# Patient Record
Sex: Female | Born: 2000 | Race: Black or African American | Hispanic: No | Marital: Single | Smoking: Never smoker
Health system: Southern US, Community
[De-identification: ages and names within clinical notes are randomized; demographics above are authoritative.]

---

## 2021-01-01 DIAGNOSIS — E0590 Thyrotoxicosis, unspecified without thyrotoxic crisis or storm: Secondary | ICD-10-CM

## 2021-01-01 HISTORY — DX: Thyrotoxicosis, unspecified without thyrotoxic crisis or storm: E05.90

## 2021-08-20 ENCOUNTER — Encounter (HOSPITAL_COMMUNITY): Payer: Self-pay

## 2021-08-20 ENCOUNTER — Emergency Department (HOSPITAL_COMMUNITY)

## 2021-08-20 ENCOUNTER — Emergency Department (HOSPITAL_COMMUNITY)
Admission: EM | Admit: 2021-08-20 | Discharge: 2021-08-20 | Disposition: A | Discharge status: Home / Self Care | Attending: Emergency Medicine | Admitting: Emergency Medicine

## 2021-08-20 ENCOUNTER — Other Ambulatory Visit: Payer: Self-pay

## 2021-08-20 DIAGNOSIS — E039 Hypothyroidism, unspecified: Secondary | ICD-10-CM | POA: Diagnosis not present

## 2021-08-20 DIAGNOSIS — N9489 Other specified conditions associated with female genital organs and menstrual cycle: Secondary | ICD-10-CM | POA: Insufficient documentation

## 2021-08-20 DIAGNOSIS — G43409 Hemiplegic migraine, not intractable, without status migrainosus: Secondary | ICD-10-CM | POA: Diagnosis not present

## 2021-08-20 DIAGNOSIS — R519 Headache, unspecified: Secondary | ICD-10-CM | POA: Diagnosis present

## 2021-08-20 DIAGNOSIS — R799 Abnormal finding of blood chemistry, unspecified: Secondary | ICD-10-CM | POA: Insufficient documentation

## 2021-08-20 LAB — T4, FREE: Free T4: 0.78 ng/dL (ref 0.61–1.12)

## 2021-08-20 LAB — TROPONIN I (HIGH SENSITIVITY): Troponin I (High Sensitivity): <2 ng/L (ref <18)

## 2021-08-20 LAB — URINALYSIS, ROUTINE W REFLEX MICROSCOPIC
Bilirubin Urine: NEGATIVE
Glucose, UA: NEGATIVE mg/dL
Hgb urine dipstick: NEGATIVE
Ketones, ur: NEGATIVE mg/dL
Leukocytes,Ua: NEGATIVE
Nitrite: NEGATIVE
Protein, ur: NEGATIVE mg/dL
Specific Gravity, Urine: 1.003 — ABNORMAL LOW (ref 1.005–1.030)
pH: 7 (ref 5.0–8.0)

## 2021-08-20 LAB — BASIC METABOLIC PANEL
Anion gap: 7 (ref 5–15)
BUN: 8 mg/dL (ref 6–20)
CO2: 22 mmol/L (ref 22–32)
Calcium: 9 mg/dL (ref 8.9–10.3)
Chloride: 110 mmol/L (ref 98–111)
Creatinine, Ser: 0.59 mg/dL (ref 0.44–1.00)
GFR, Estimated: >60 mL/min (ref >60)
Glucose, Bld: 98 mg/dL (ref 70–99)
Potassium: 3.8 mmol/L (ref 3.5–5.1)
Sodium: 139 mmol/L (ref 135–145)

## 2021-08-20 LAB — I-STAT BETA HCG BLOOD, ED (MC, WL, AP ONLY): I-stat hCG, quantitative: <5 m[IU]/mL (ref <5)

## 2021-08-20 LAB — CBC
HCT: 36.7 % (ref 36.0–46.0)
Hemoglobin: 11.7 g/dL — ABNORMAL LOW (ref 12.0–15.0)
MCH: 24.5 pg — ABNORMAL LOW (ref 26.0–34.0)
MCHC: 31.9 g/dL (ref 30.0–36.0)
MCV: 76.8 fL — ABNORMAL LOW (ref 80.0–100.0)
Platelets: 384 10*3/uL (ref 150–400)
RBC: 4.78 MIL/uL (ref 3.87–5.11)
RDW: 16 % — ABNORMAL HIGH (ref 11.5–15.5)
WBC: 9.2 10*3/uL (ref 4.0–10.5)
nRBC: 0 % (ref 0.0–0.2)

## 2021-08-20 LAB — TSH: TSH: 0.024 u[IU]/mL — ABNORMAL LOW (ref 0.350–4.500)

## 2021-08-20 LAB — MAGNESIUM: Magnesium: 1.9 mg/dL (ref 1.7–2.4)

## 2021-08-20 LAB — CBG MONITORING, ED: Glucose-Capillary: 111 mg/dL — ABNORMAL HIGH (ref 70–99)

## 2021-08-20 IMAGING — MR MR HEAD W/O CM
10 series · 48 of 48 positions shown · non-contrast
Comparison: MR venogram same day

CLINICAL DATA: Neuro deficit, acute, stroke suspected. Nausea,
vomiting and weakness over the last 48 hours. Left sided weakness
and pain.

EXAM:
MRI HEAD WITHOUT CONTRAST
TECHNIQUE: Multiplanar, multiecho pulse sequences of the brain and surrounding
structures were obtained without intravenous contrast.

[Series 13: DWI · axial · 3.0mm · 1.36mm/px · z∈[-87,+65]mm · 8 of 104 slices shown (1 of 4)]
[im 1/104]
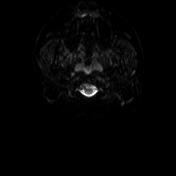
[im 15/104]
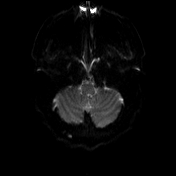
[im 30/104]
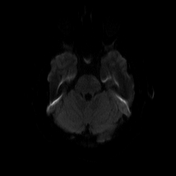
[im 45/104]
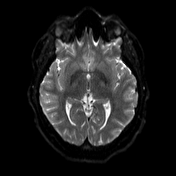
[im 59/104]
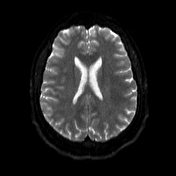
[im 74/104]
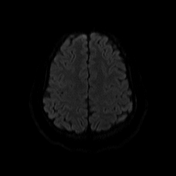
[im 89/104]
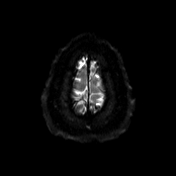
[im 104/104]
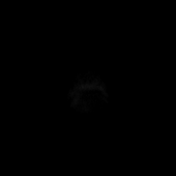

[Series 14: DWI · axial · 3.0mm · 1.36mm/px · z∈[-87,+65]mm · 4 of 52 slices shown (2 of 4)]
[im 1/52]
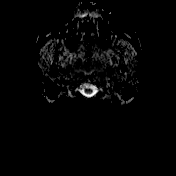
[im 18/52]
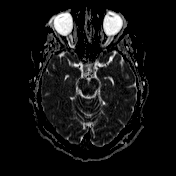
[im 35/52]
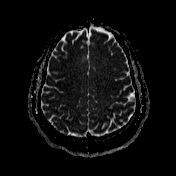
[im 52/52]
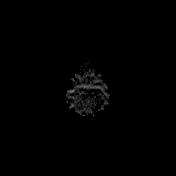

[Series 15: T1 · sagittal · 5.0mm · 0.75mm/px · 2 of 24 slices shown (1 of 2)]
[im 1/24]
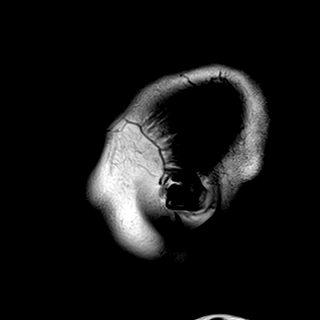
[im 24/24]
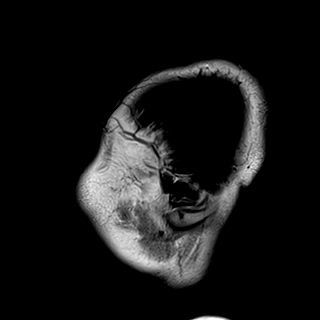

[Series 16: T2 · axial · 5.0mm · 0.62mm/px · z∈[-82,+60]mm · 2 of 23 slices shown (1 of 2)]
[im 1/23]
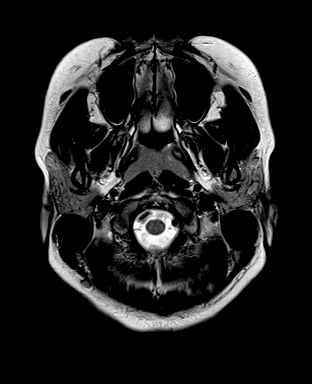
[im 23/23]
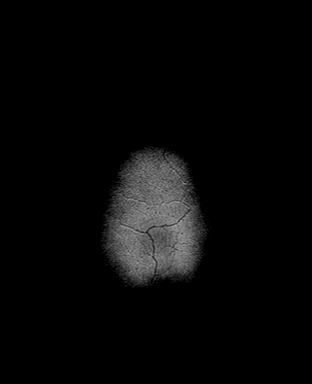

[Series 17: swi_images · axial · 3.0mm · 0.75mm/px · z∈[-93,+71]mm · 5 of 56 slices shown]
[im 1/56]
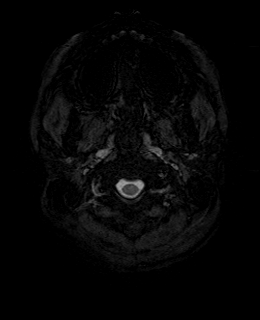
[im 14/56]
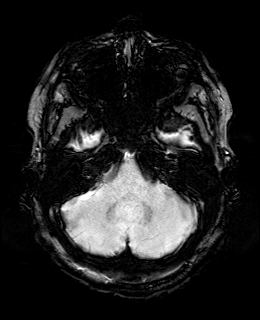
[im 28/56]
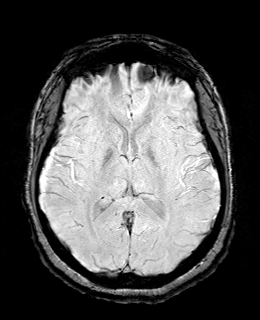
[im 42/56]
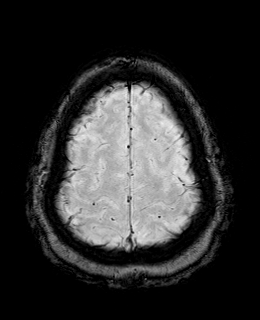
[im 56/56]
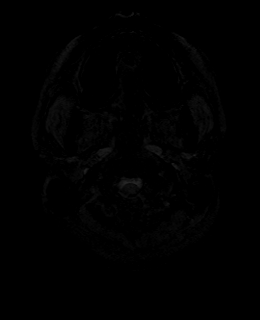

[Series 19: T1 · axial · 1.0mm · 0.94mm/px · z∈[-86,+56]mm · 12 of 144 slices shown (2 of 2)]
[im 1/144]
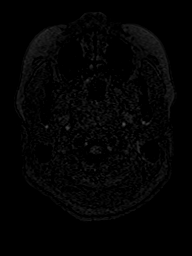
[im 14/144]
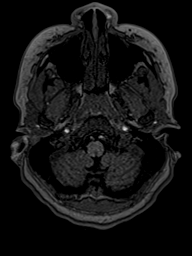
[im 27/144]
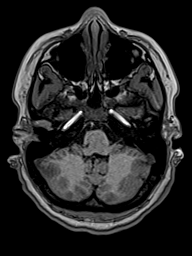
[im 40/144]
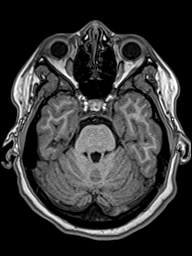
[im 53/144]
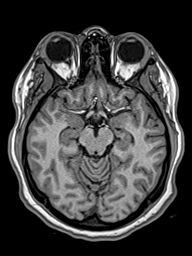
[im 66/144]
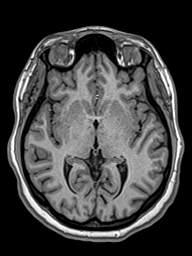
[im 79/144]
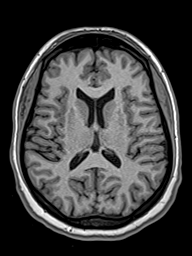
[im 92/144]
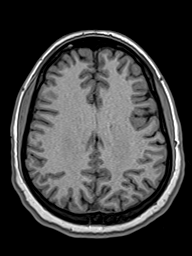
[im 105/144]
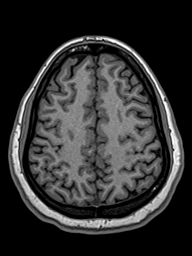
[im 118/144]
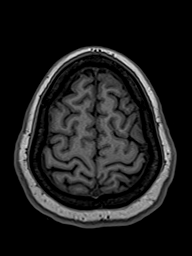
[im 131/144]
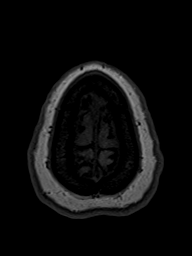
[im 144/144]
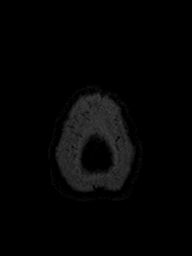

[Series 20: FLAIR · axial · 3.0mm · 0.75mm/px · z∈[-84,+57]mm · 4 of 48 slices shown]
[im 1/48]
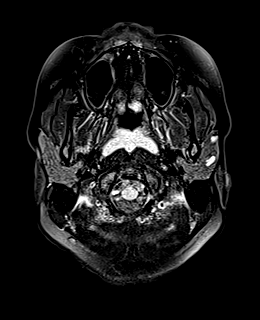
[im 16/48]
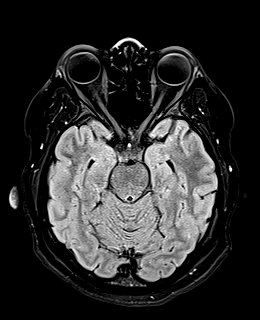
[im 32/48]
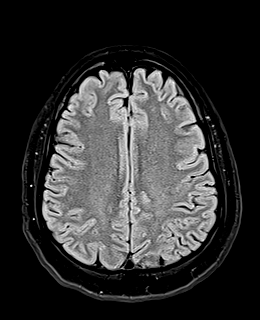
[im 48/48]
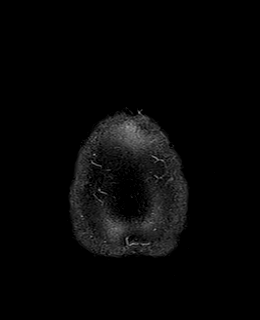

[Series 21: DWI · coronal · 5.0mm · 1.31mm/px · 6 of 76 slices shown (3 of 4)]
[im 1/76]
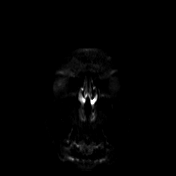
[im 16/76]
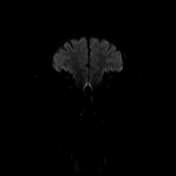
[im 31/76]
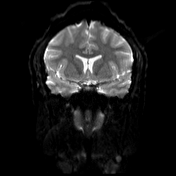
[im 46/76]
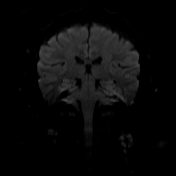
[im 61/76]
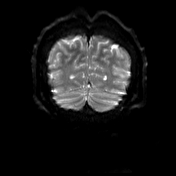
[im 76/76]
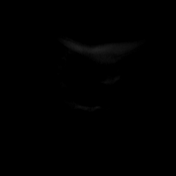

[Series 22: DWI · coronal · 5.0mm · 1.31mm/px · 3 of 38 slices shown (4 of 4)]
[im 1/38]
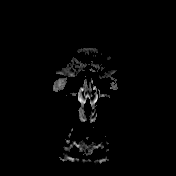
[im 19/38]
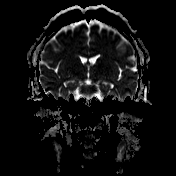
[im 38/38]
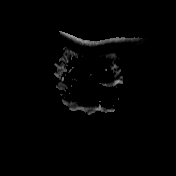

[Series 23: T2 · coronal · 5.0mm · 0.57mm/px · 2 of 30 slices shown (2 of 2)]
[im 1/30]
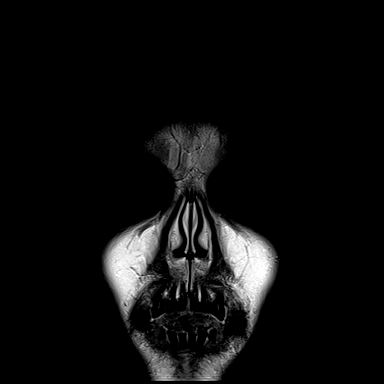
[im 30/30]
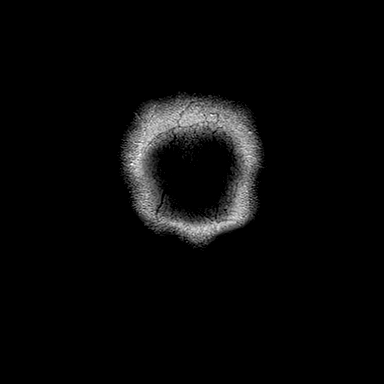

[48 of 48 positions shown; findings below may reference images not displayed]

FINDINGS: Brain: The brain has a normal appearance without evidence of
malformation, atrophy, old or acute small or large vessel
infarction, mass lesion, hemorrhage, hydrocephalus or extra-axial
collection.

Vascular: Major vessels at the base of the brain show flow. Venous
sinuses appear patent.

Skull and upper cervical spine: Normal.

Sinuses/Orbits: Clear/normal.

Other: None significant.
IMPRESSION: Normal examination. No abnormality seen to explain the clinical
presentation.

## 2021-08-20 IMAGING — MR MR [PERSON_NAME] HEAD
1 series · 48 of 48 positions shown · non-contrast
Comparison: No pertinent prior exam.

CLINICAL DATA: Nausea, vomiting and weakness. Dural venous sinus
thrombosis suspected.

EXAM:
MR VENOGRAM HEAD WITHOUT CONTRAST
TECHNIQUE: Angiographic images of the intracranial venous structures were
acquired using MRV technique without intravenous contrast.

[Series 4: cor 2d · coronal · 2.5mm · 0.69mm/px · 48 of 120 slices shown]
[im 1/120]
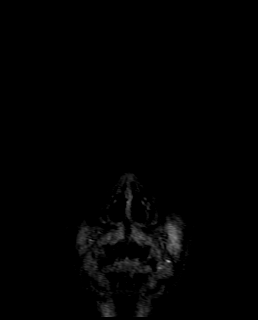
[im 3/120]
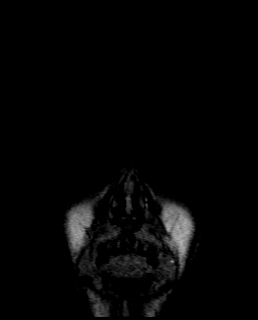
[im 6/120]
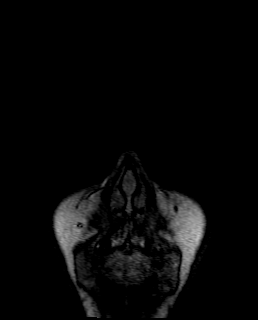
[im 8/120]
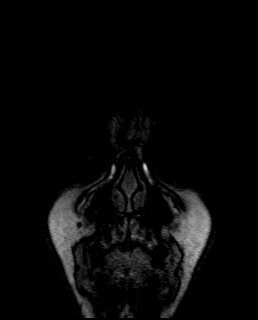
[im 11/120]
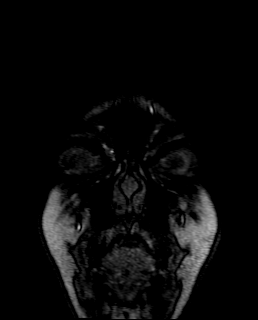
[im 13/120]
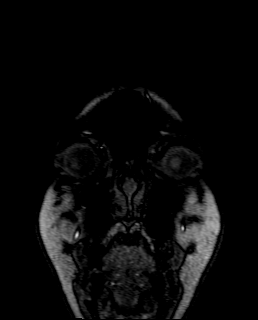
[im 16/120]
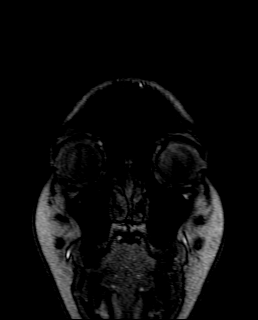
[im 18/120]
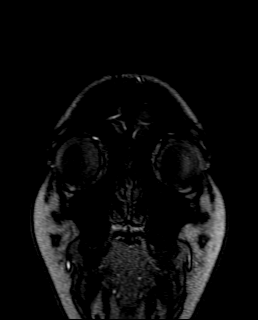
[im 21/120]
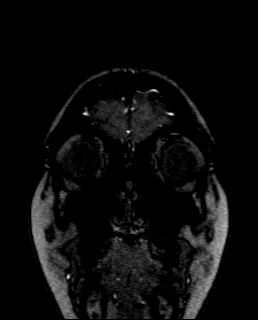
[im 23/120]
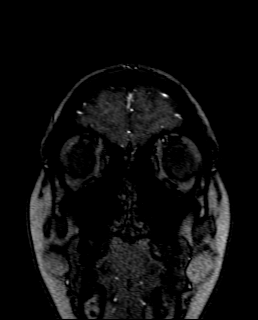
[im 26/120]
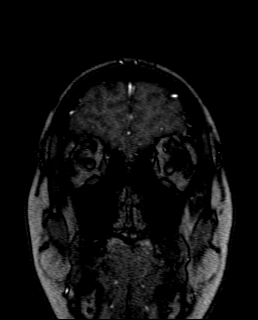
[im 28/120]
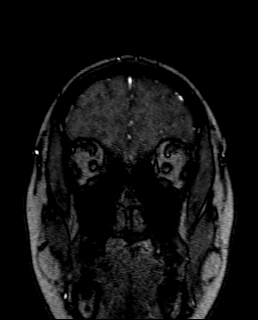
[im 31/120]
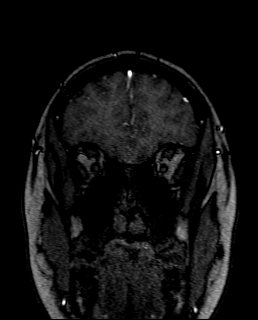
[im 33/120]
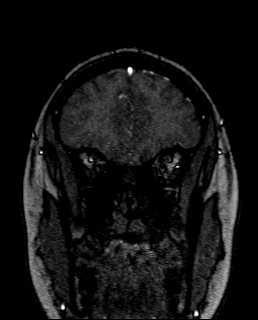
[im 36/120]
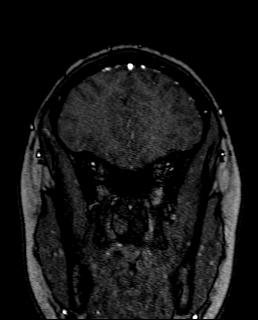
[im 38/120]
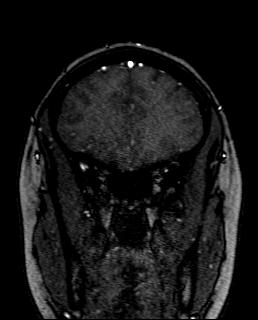
[im 41/120]
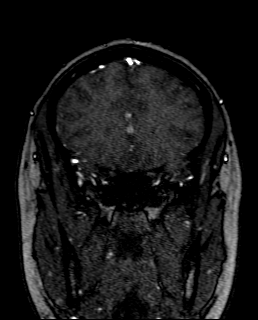
[im 44/120]
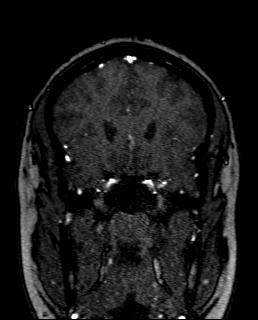
[im 46/120]
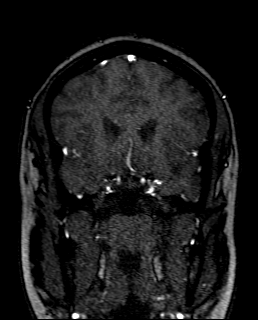
[im 49/120]
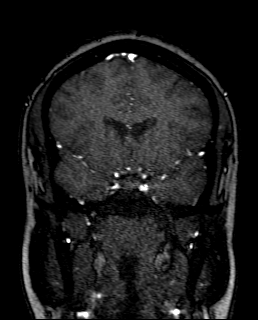
[im 51/120]
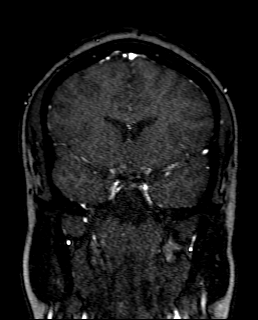
[im 54/120]
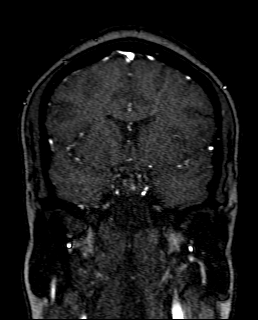
[im 56/120]
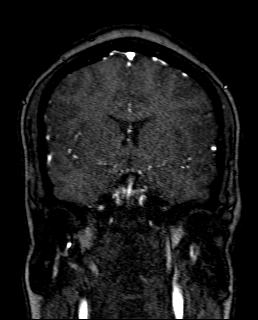
[im 59/120]
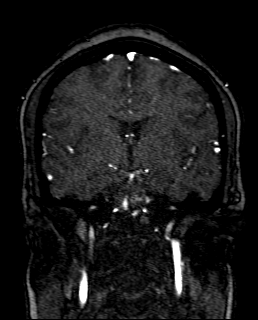
[im 61/120]
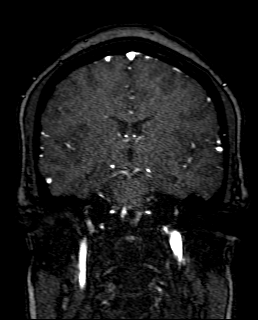
[im 64/120]
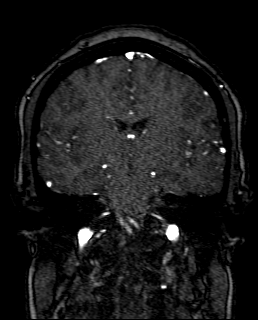
[im 66/120]
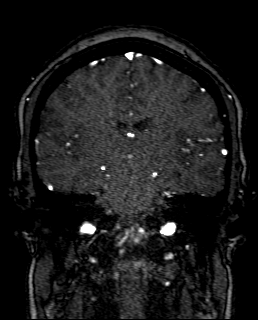
[im 69/120]
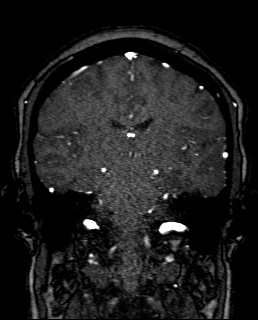
[im 71/120]
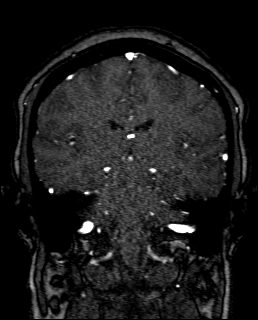
[im 74/120]
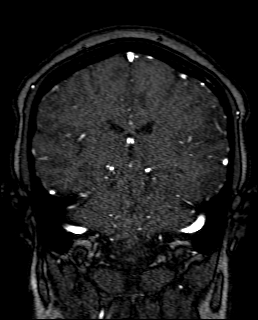
[im 76/120]
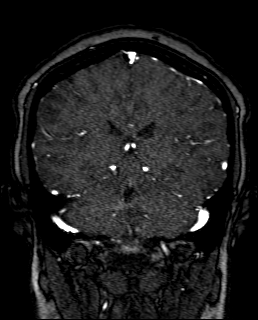
[im 79/120]
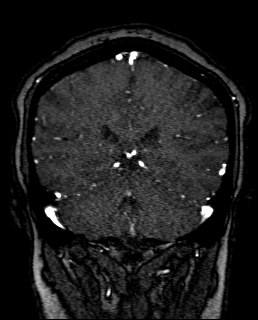
[im 82/120]
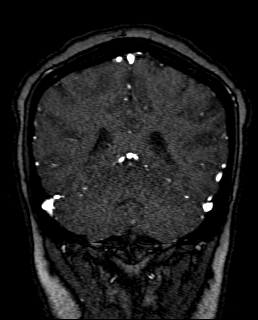
[im 84/120]
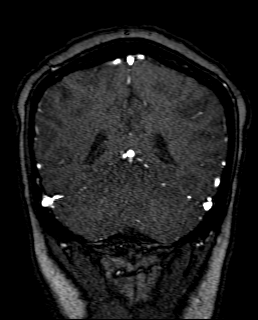
[im 87/120]
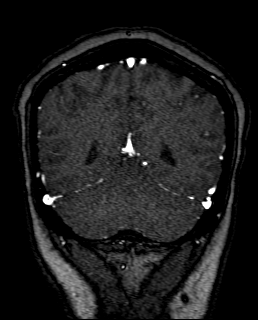
[im 89/120]
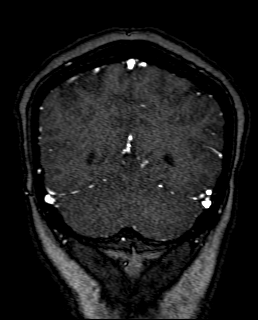
[im 92/120]
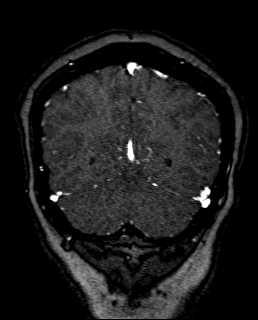
[im 94/120]
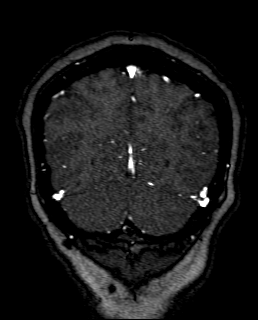
[im 97/120]
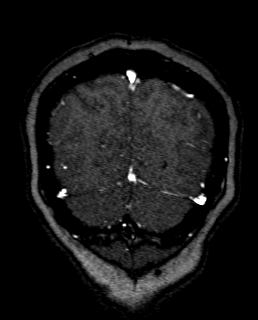
[im 99/120]
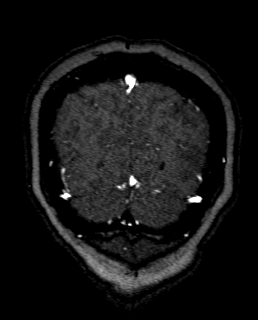
[im 102/120]
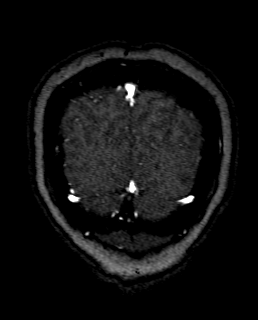
[im 104/120]
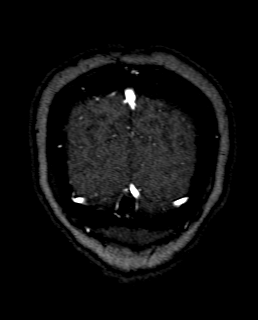
[im 107/120]
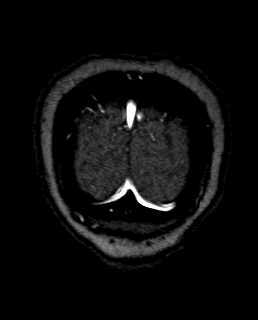
[im 109/120]
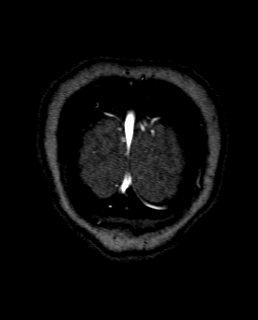
[im 112/120]
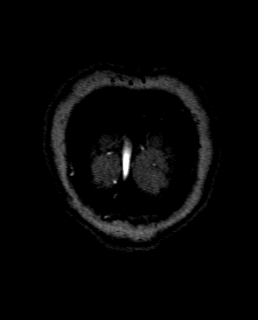
[im 114/120]
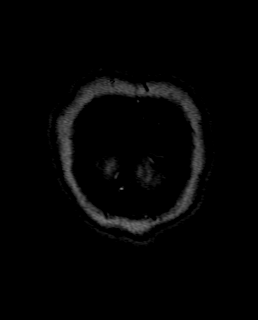
[im 117/120]
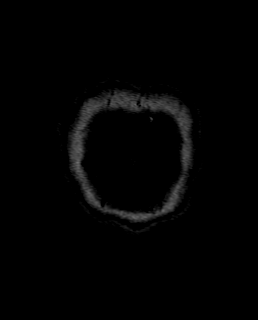
[im 120/120]
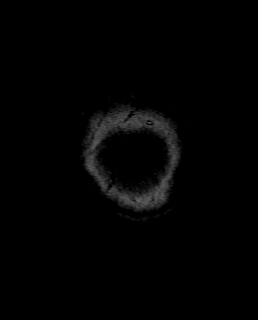

[48 of 48 positions shown; findings below may reference images not displayed]

FINDINGS: Superior sagittal sinus is widely patent. Both transverse sinuses
are patent. Sigmoid sinuses and jugular veins are patent. Straight
sinus is patent. No sign of superficial thrombosis.
IMPRESSION: Normal intracranial MR venogram.

## 2021-08-20 IMAGING — CT CT HEAD W/O CM
3 series · 16 of 47 positions shown, 19 images · non-contrast
Comparison: None available.

CLINICAL DATA: Headache, new or worsening headache in a 20-year-old
female.



[Series 2: head wo · axial · 0.43mm/px · z∈[+1087,+1222]mm · 10 of 33 slices shown, 13 images]
[im 3/33  brain]
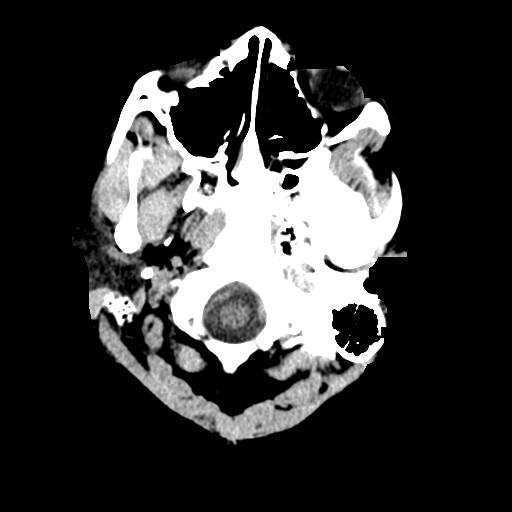
[im 3/33  bone]
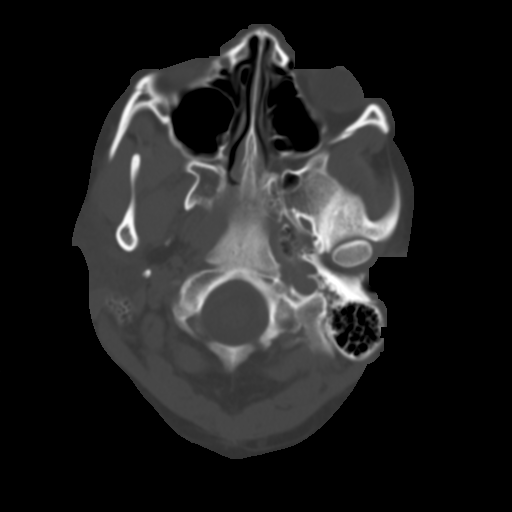
[im 6/33  brain]
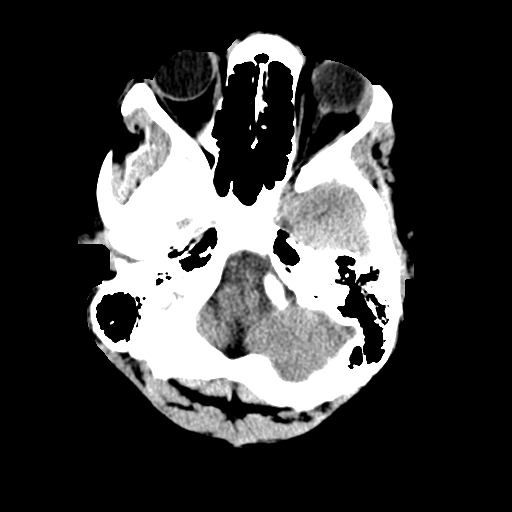
[im 9/33  brain]
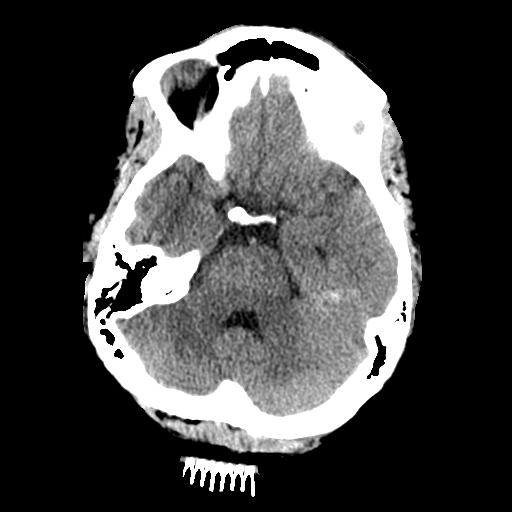
[im 12/33  brain]
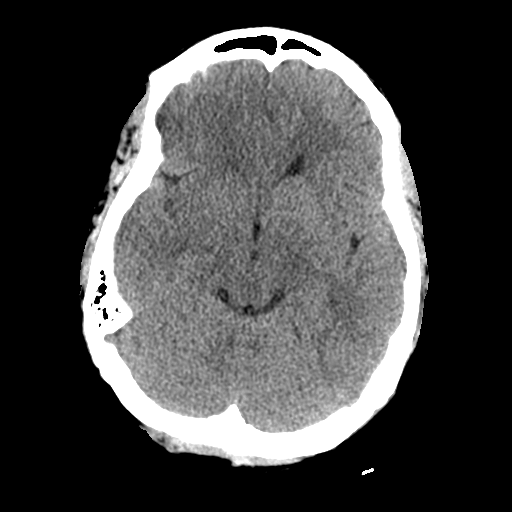
[im 15/33  brain]
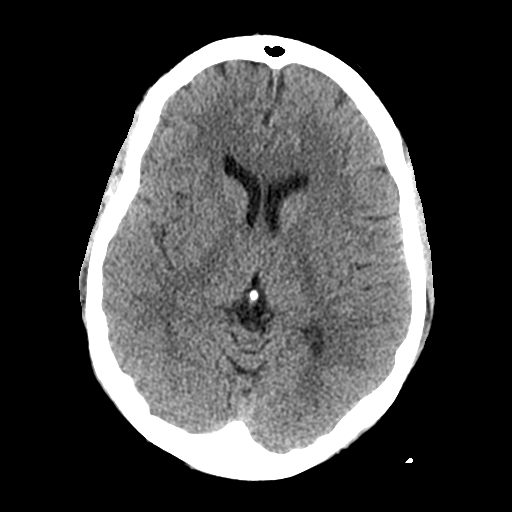
[im 15/33  bone]
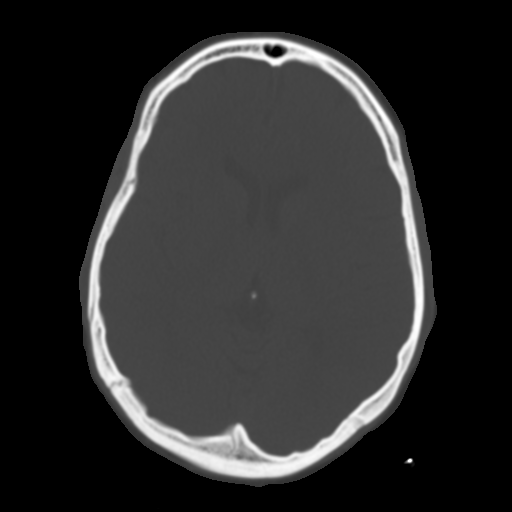
[im 18/33  brain]
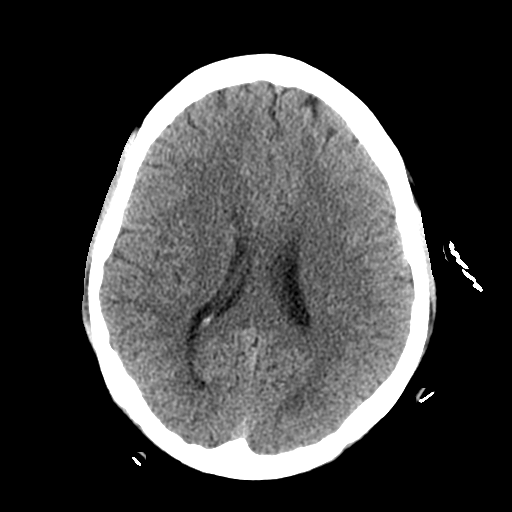
[im 21/33  brain]
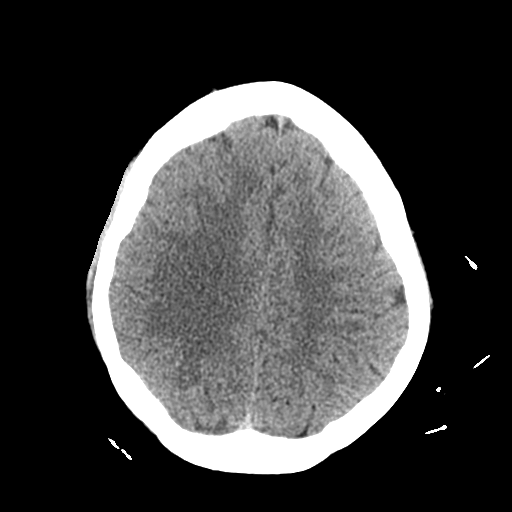
[im 25/33  brain]
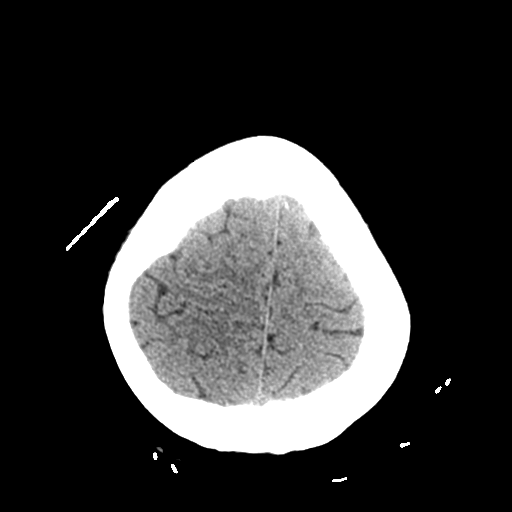
[im 27/33  brain]
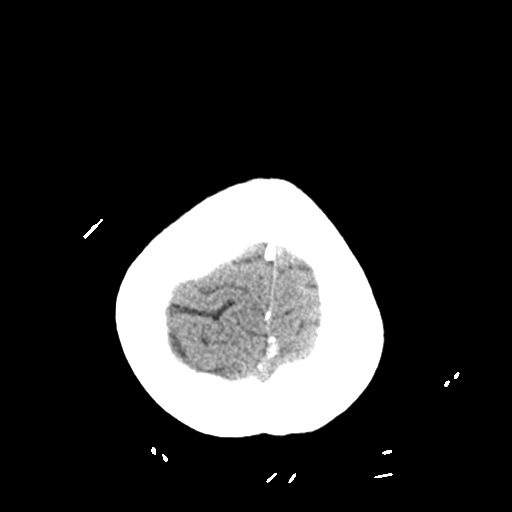
[im 27/33  bone]
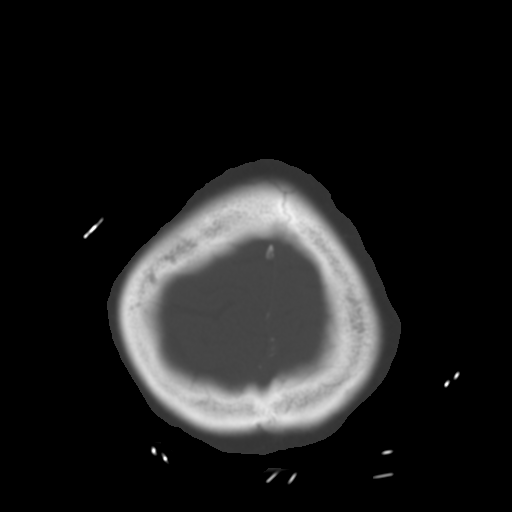
[im 30/33  brain]
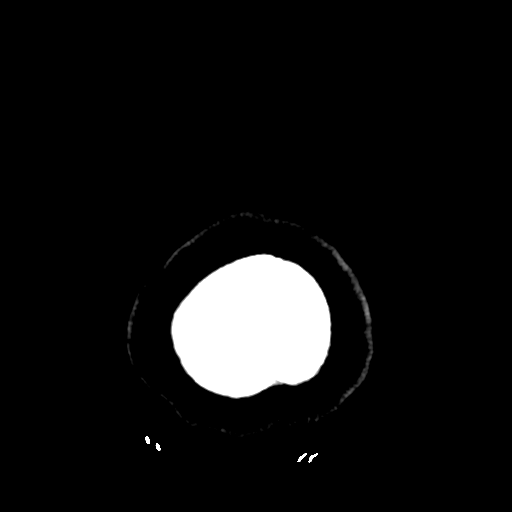

[Series 5: coronal soft tissue · coronal · 0.32mm/px · 3 of 69 slices shown]
[im 24/69  brain]
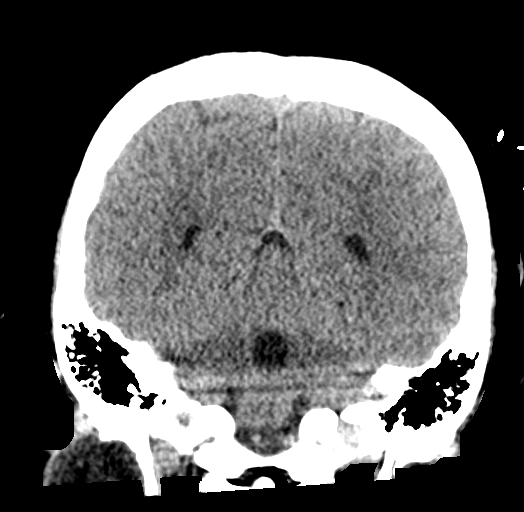
[im 31/69  brain]
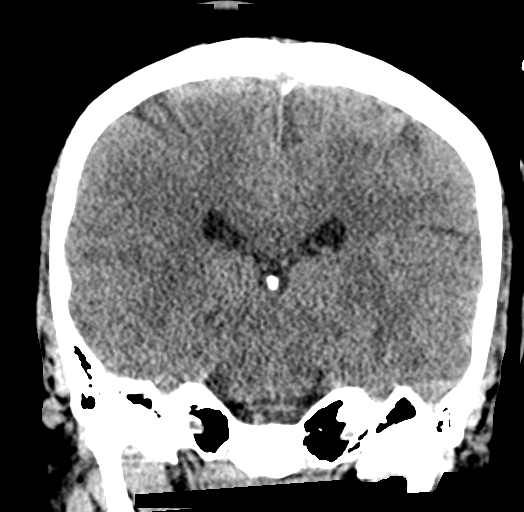
[im 38/69  brain]
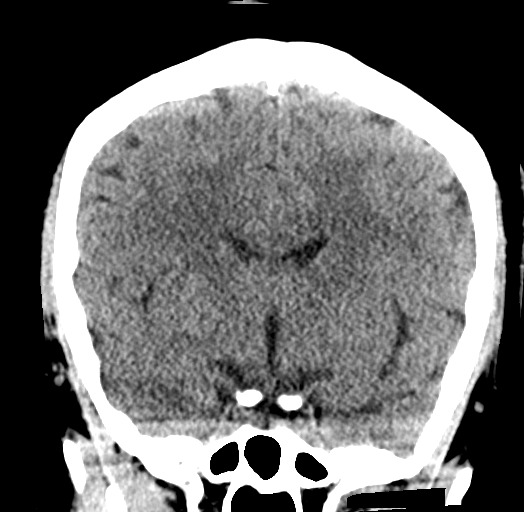

[Series 6: sagittal soft tissue · sagittal · 0.32mm/px · 3 of 57 slices shown]
[im 19/57  brain]
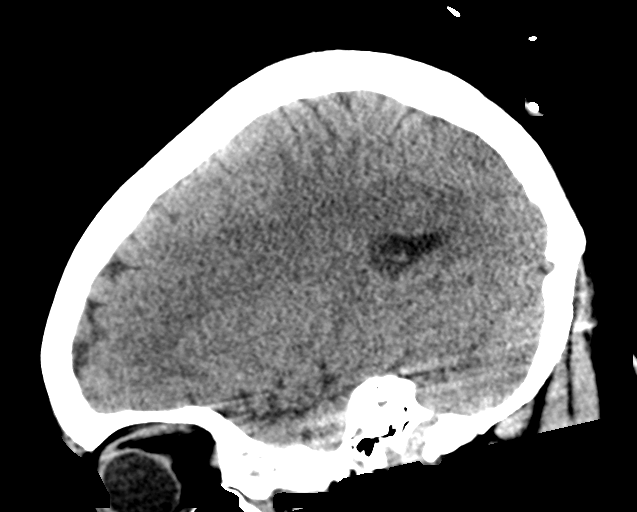
[im 29/57  brain]
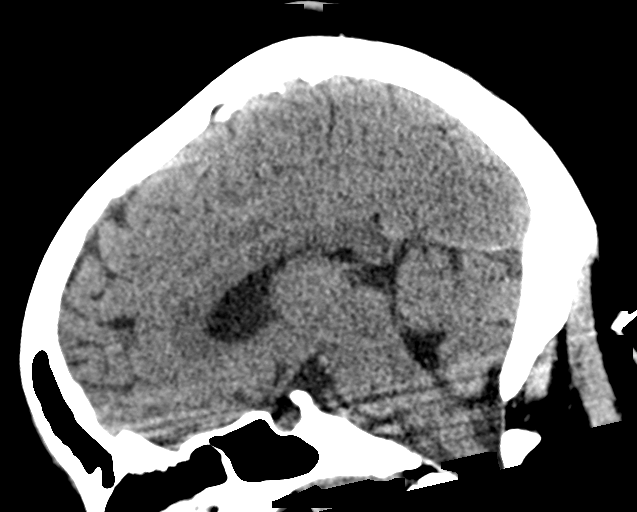
[im 38/57  brain]
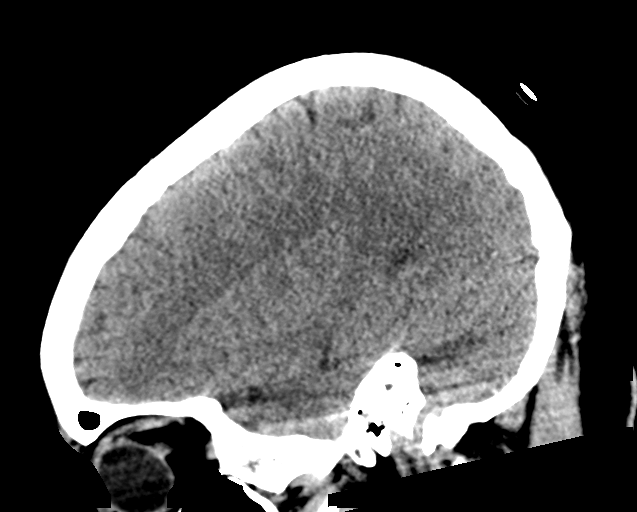

[16 of 47 positions shown; findings below may reference images not displayed]

FINDINGS: Brain: No evidence of acute infarction, hemorrhage, hydrocephalus,
extra-axial collection or mass lesion/mass effect.

Vascular: No hyperdense vessel or unexpected calcification.

Skull: Normal. Negative for fracture or focal lesion.

Sinuses/Orbits: Visualized paranasal sinuses and orbits are
unremarkable.

Other: None
IMPRESSION: No acute intracranial abnormality.

## 2021-08-20 MED ORDER — MAGNESIUM SULFATE 2 GM/50ML IV SOLN
2.0000 g | Freq: Once | INTRAVENOUS | Status: AC
  Administered 2021-08-20: 2 g via INTRAVENOUS
  Filled 2021-08-20: qty 50

## 2021-08-20 MED ORDER — DIPHENHYDRAMINE HCL 50 MG/ML IJ SOLN
25.0000 mg | Freq: Once | INTRAMUSCULAR | Status: AC
  Administered 2021-08-20: 25 mg via INTRAVENOUS
  Filled 2021-08-20: qty 1

## 2021-08-20 MED ORDER — LACTATED RINGERS IV BOLUS
1000.0000 mL | Freq: Once | INTRAVENOUS | Status: AC
  Administered 2021-08-20: 1000 mL via INTRAVENOUS

## 2021-08-20 MED ORDER — SODIUM CHLORIDE 0.9 % IV SOLN: INTRAVENOUS | Status: DC

## 2021-08-20 MED ORDER — ACETAMINOPHEN 325 MG PO TABS
650.0000 mg | ORAL_TABLET | Freq: Once | ORAL | Status: AC
  Administered 2021-08-20: 650 mg via ORAL
  Filled 2021-08-20: qty 2

## 2021-08-20 MED ORDER — METOCLOPRAMIDE HCL 5 MG/ML IJ SOLN
10.0000 mg | Freq: Once | INTRAMUSCULAR | Status: AC
  Administered 2021-08-20: 10 mg via INTRAVENOUS
  Filled 2021-08-20: qty 2

## 2021-08-20 MED ORDER — ACETAMINOPHEN 500 MG PO TABS
1000.0000 mg | ORAL_TABLET | Freq: Once | ORAL | Status: AC
  Administered 2021-08-20: 1000 mg via ORAL
  Filled 2021-08-20: qty 2

## 2021-08-20 MED ORDER — VALPROATE SODIUM 100 MG/ML IV SOLN
500.0000 mg | Freq: Once | INTRAVENOUS | Status: AC
  Administered 2021-08-20: 500 mg via INTRAVENOUS
  Filled 2021-08-20: qty 5

## 2021-08-20 MED ORDER — ONDANSETRON HCL 4 MG/2ML IJ SOLN
4.0000 mg | Freq: Once | INTRAMUSCULAR | Status: AC
  Administered 2021-08-20: 4 mg via INTRAVENOUS
  Filled 2021-08-20: qty 2

## 2021-08-20 MED ORDER — SODIUM CHLORIDE 0.9 % IV BOLUS
1000.0000 mL | Freq: Once | INTRAVENOUS | Status: AC
  Administered 2021-08-20: 1000 mL via INTRAVENOUS

## 2021-08-20 NOTE — ED Provider Notes (Signed)
?[facility]-EMERGENCY DEPT ?Provider Note ? ? ?CSN: [id number] ?Arrival date & time: 08/20/21  1006 ? ?  ? ?History ? ?Chief Complaint  ?Patient presents with  ? Nausea  ? Emesis  ? Weakness  ? ? ?Michaela Maddox is a 21 y.o. female. ? ? ?Emesis ?Associated symptoms: headaches   ?Weakness ?Associated symptoms: headaches, nausea and vomiting   ? ?21 year old female with a history of hypothyroidism presenting to the emergency department with headache, blurry vision, left facial droop and left hemibody weakness since Friday. ? ?On Friday, she noted left hemibody numbness. She endorses a headache. Left sided burning along the left side of her face. She endorses blurry vision. She endorses a LFD. Her left leg feels weak in addition to her left arm. Also since last Friday. No fevers or chills. Saturday she felt lightheaded, thought she was dehydrated. Near syncopal episode then. ? ?No history of blood clots in her legs or lungs.  ? ?Home Medications ?Prior to Admission medications   ?Not on File  ?   ? ?Allergies    ?Patient has no known allergies.   ? ?Review of Systems   ?Review of Systems  ?Eyes:  Positive for visual disturbance.  ?Gastrointestinal:  Positive for nausea and vomiting.  ?Neurological:  Positive for weakness, light-headedness and headaches.  ?All other systems reviewed and are negative. ? ?Physical Exam ?Updated Vital Signs ?BP 119/75   Pulse 82   Temp 98.9 ?F (37.2 ?C) (Oral)   Resp 18   Ht 5\' 9"  (1.753 m)   Wt 104.3 kg   LMP 08/12/2021 (Approximate)   SpO2 99%   BMI 33.97 kg/m?  ?Physical Exam ?Vitals and nursing note reviewed.  ?Constitutional:   ?   General: She is not in acute distress. ?   Appearance: She is well-developed.  ?HENT:  ?   Head: Normocephalic and atraumatic.  ?Eyes:  ?   Conjunctiva/sclera: Conjunctivae normal.  ?Neck:  ?   Comments: No meningismus ?Cardiovascular:  ?   Rate and Rhythm: Normal rate and regular rhythm.  ?Pulmonary:  ?   Effort: Pulmonary  effort is normal. No respiratory distress.  ?   Breath sounds: Normal breath sounds.  ?Abdominal:  ?   Palpations: Abdomen is soft.  ?   Tenderness: There is no abdominal tenderness.  ?Musculoskeletal:     ?   General: No swelling.  ?   Cervical back: Neck supple.  ?Skin: ?   General: Skin is warm and dry.  ?   Capillary Refill: Capillary refill takes less than 2 seconds.  ?Neurological:  ?   Mental Status: She is alert.  ?   Comments: MENTAL STATUS EXAM:    ?Orientation: Alert and oriented to person, place and time.  ?Memory: Cooperative, follows commands well.  ?Language: Speech is clear and language is normal.  ? ?CRANIAL NERVES:    ?CN 2 (Optic): Visual fields intact to confrontation.  ?CN 3,4,6 (EOM): Pupils equal and reactive to light. Full extraocular eye movement without nystagmus.  ?CN 5 (Trigeminal): Facial sensation is decreased on the left to light touch, no weakness of masticatory muscles.  ?CN 7 (Facial): LFD present on arrival ?CN 8 (Auditory): Auditory acuity grossly normal.  ?CN 9,10 (Glossophar): The uvula is midline, the palate elevates symmetrically.  ?CN 11 (spinal access): Normal sternocleidomastoid and trapezius strength.  ?CN 12 (Hypoglossal): The tongue deviates to the right. No atrophy or fasciculations..  ? ?MOTOR:  Muscle Strength: 5/5RUE, 4/5LUE, 5/5RLE, 4/5LLE ?.  ? ?  COORDINATION:   No tremor.  ? ?SENSATION:   Intact to light touch all four extremities, decreased sensation to light touch in the left hemibody. ? ?GAIT: Gait not assessed ?  ?Psychiatric:     ?   Mood and Affect: Mood normal.  ? ? ?ED Results / Procedures / Treatments   ?Labs ?(all labs ordered are listed, but only abnormal results are displayed) ?Labs Reviewed  ?CBC - Abnormal; Notable for the following components:  ?    Result Value  ? Hemoglobin 11.7 (*)   ? MCV 76.8 (*)   ? MCH 24.5 (*)   ? RDW 16.0 (*)   ? All other components within normal limits  ?URINALYSIS, ROUTINE W REFLEX MICROSCOPIC - Abnormal; Notable for the  following components:  ? Color, Urine COLORLESS (*)   ? Specific Gravity, Urine 1.003 (*)   ? All other components within normal limits  ?TSH - Abnormal; Notable for the following components:  ? TSH 0.024 (*)   ? All other components within normal limits  ?CBG MONITORING, ED - Abnormal; Notable for the following components:  ? Glucose-Capillary 111 (*)   ? All other components within normal limits  ?BASIC METABOLIC PANEL  ?T4, FREE  ?MAGNESIUM  ?I-STAT BETA HCG BLOOD, ED ([facility], [facility], [facility] ONLY)  ?TROPONIN I (HIGH SENSITIVITY)  ? ? ?EKG ?EKG Interpretation ? ?Date/Time:  Monday August 20 2021 10:31:51 EDT ?Ventricular Rate:  84 ?PR Interval:  191 ?QRS Duration: 90 ?QT Interval:  374 ?QTC Calculation: 443 ?R Axis:   89 ?Text Interpretation: Sinus rhythm Benign early repolarization Confirmed by Ernie Avena ([id number]) on 08/20/2021 10:47:16 AM ? ?Radiology ?CT HEAD WO CONTRAST ( ) ? ?Result Date: 08/20/2021 ?CLINICAL DATA:  Headache, new or worsening headache in a 21 year old female. EXAM: CT HEAD WITHOUT CONTRAST TECHNIQUE: Contiguous axial images were obtained from the base of the skull through the vertex without intravenous contrast. RADIATION DOSE REDUCTION: This exam was performed according to the departmental dose-optimization program which includes automated exposure control, adjustment of the mA and/or kV according to patient size and/or use of iterative reconstruction technique. COMPARISON:  None available. FINDINGS: Brain: No evidence of acute infarction, hemorrhage, hydrocephalus, extra-axial collection or mass lesion/mass effect. Vascular: No hyperdense vessel or unexpected calcification. Skull: Normal. Negative for fracture or focal lesion. Sinuses/Orbits: Visualized paranasal sinuses and orbits are unremarkable. Other: None IMPRESSION: No acute intracranial abnormality. Electronically Signed   By: Donzetta Kohut M.D.   On: 08/20/2021 13:13  ? ?MR BRAIN WO CONTRAST ? ?Result Date: 08/20/2021 ?CLINICAL DATA:  Neuro  deficit, acute, stroke suspected. Nausea, vomiting and weakness over the last 48 hours. Left sided weakness and pain. EXAM: MRI HEAD WITHOUT CONTRAST TECHNIQUE: Multiplanar, multiecho pulse sequences of the brain and surrounding structures were obtained without intravenous contrast. COMPARISON:  MR venogram same day FINDINGS: Brain: The brain has a normal appearance without evidence of malformation, atrophy, old or acute small or large vessel infarction, mass lesion, hemorrhage, hydrocephalus or extra-axial collection. Vascular: Major vessels at the base of the brain show flow. Venous sinuses appear patent. Skull and upper cervical spine: Normal. Sinuses/Orbits: Clear/normal. Other: None significant. IMPRESSION: Normal examination. No abnormality seen to explain the clinical presentation. Electronically Signed   By: Paulina Fusi M.D.   On: 08/20/2021 15:09  ? ?MR Venogram Head ? ?Result Date: 08/20/2021 ?CLINICAL DATA:  Nausea, vomiting and weakness. Dural venous sinus thrombosis suspected. EXAM: MR VENOGRAM HEAD WITHOUT CONTRAST TECHNIQUE: Angiographic images of the intracranial venous structures were  acquired using MRV technique without intravenous contrast. COMPARISON:  No pertinent prior exam. FINDINGS: Superior sagittal sinus is widely patent. Both transverse sinuses are patent. Sigmoid sinuses and jugular veins are patent. Straight sinus is patent. No sign of superficial thrombosis. IMPRESSION: Normal intracranial MR venogram. Electronically Signed   By: Paulina FusiMark  Shogry M.D.   On: 08/20/2021 15:08   ? ?Procedures ?Procedures  ? ? ?Medications Ordered in ED ?Medications  ?sodium chloride 0.9 % bolus 1,000 mL (0 mLs Intravenous Stopped 08/20/21 1359)  ?  And  ?0.9 %  sodium chloride infusion (0 mLs Intravenous Stopped 08/20/21 1901)  ?ondansetron (ZOFRAN) injection 4 mg (4 mg Intravenous Given 08/20/21 1102)  ?lactated ringers bolus 1,000 mL (0 mLs Intravenous Stopped 08/20/21 1200)  ?acetaminophen (TYLENOL) tablet  1,000 mg (1,000 mg Oral Given 08/20/21 1233)  ?metoCLOPramide (REGLAN) injection 10 mg (10 mg Intravenous Given 08/20/21 1234)  ?diphenhydrAMINE (BENADRYL) injection 25 mg (25 mg Intravenous Given 08/20/21 1233)  ?val

## 2021-08-20 NOTE — ED Notes (Signed)
Patient to MRI.

## 2021-08-20 NOTE — ED Triage Notes (Signed)
Pt coming from home via EMS with c/o n/v and weakness x 48hrs. Patient states that they feel weakness on their whole left side and pain when they try to move their left extremities. Per EMS pt had a strong grip bilaterally and was able to scoot themselves over to ED stretcher.  ? ?Pt has hx of hyperthyroidism and states that last time they had a flare-up it was similar to what they are experiencing now.  ?

## 2021-08-20 NOTE — Discharge Instructions (Signed)
Your symptoms are likely due to a migraine headache. Recommend routine follow-up with neurology. ?

## 2021-09-05 ENCOUNTER — Ambulatory Visit: Admitting: Psychiatry

## 2023-04-07 ENCOUNTER — Emergency Department (HOSPITAL_COMMUNITY)
Admission: EM | Admit: 2023-04-07 | Discharge: 2023-04-07 | Disposition: A | Discharge status: Home / Self Care | Attending: Emergency Medicine | Admitting: Emergency Medicine

## 2023-04-07 ENCOUNTER — Other Ambulatory Visit: Payer: Self-pay

## 2023-04-07 ENCOUNTER — Encounter (HOSPITAL_COMMUNITY): Payer: Self-pay

## 2023-04-07 DIAGNOSIS — D72829 Elevated white blood cell count, unspecified: Secondary | ICD-10-CM | POA: Diagnosis not present

## 2023-04-07 DIAGNOSIS — R000 Tachycardia, unspecified: Secondary | ICD-10-CM | POA: Insufficient documentation

## 2023-04-07 DIAGNOSIS — O368130 Decreased fetal movements, third trimester, not applicable or unspecified: Secondary | ICD-10-CM | POA: Insufficient documentation

## 2023-04-07 DIAGNOSIS — Z3A28 28 weeks gestation of pregnancy: Secondary | ICD-10-CM | POA: Insufficient documentation

## 2023-04-07 LAB — CBC WITH DIFFERENTIAL/PLATELET
Abs Immature Granulocytes: 0.09 10*3/uL — ABNORMAL HIGH (ref 0.00–0.07)
Basophils Absolute: 0 10*3/uL (ref 0.0–0.1)
Basophils Relative: 0 %
Eosinophils Absolute: 0.3 10*3/uL (ref 0.0–0.5)
Eosinophils Relative: 2 %
HCT: 31.4 % — ABNORMAL LOW (ref 36.0–46.0)
Hemoglobin: 10.2 g/dL — ABNORMAL LOW (ref 12.0–15.0)
Immature Granulocytes: 1 %
Lymphocytes Relative: 28 %
Lymphs Abs: 3.7 10*3/uL (ref 0.7–4.0)
MCH: 26 pg (ref 26.0–34.0)
MCHC: 32.5 g/dL (ref 30.0–36.0)
MCV: 79.9 fL — ABNORMAL LOW (ref 80.0–100.0)
Monocytes Absolute: 1.3 10*3/uL — ABNORMAL HIGH (ref 0.1–1.0)
Monocytes Relative: 10 %
Neutro Abs: 7.8 10*3/uL — ABNORMAL HIGH (ref 1.7–7.7)
Neutrophils Relative %: 59 %
Platelets: 344 10*3/uL (ref 150–400)
RBC: 3.93 MIL/uL (ref 3.87–5.11)
RDW: 17.2 % — ABNORMAL HIGH (ref 11.5–15.5)
WBC: 13.2 10*3/uL — ABNORMAL HIGH (ref 4.0–10.5)
nRBC: 0 % (ref 0.0–0.2)

## 2023-04-07 LAB — BASIC METABOLIC PANEL
Anion gap: 7 (ref 5–15)
BUN: <5 mg/dL — ABNORMAL LOW (ref 6–20)
CO2: 18 mmol/L — ABNORMAL LOW (ref 22–32)
Calcium: 8.7 mg/dL — ABNORMAL LOW (ref 8.9–10.3)
Chloride: 111 mmol/L (ref 98–111)
Creatinine, Ser: 0.49 mg/dL (ref 0.44–1.00)
GFR, Estimated: >60 mL/min (ref >60)
Glucose, Bld: 92 mg/dL (ref 70–99)
Potassium: 3.5 mmol/L (ref 3.5–5.1)
Sodium: 136 mmol/L (ref 135–145)

## 2023-04-07 LAB — TROPONIN I (HIGH SENSITIVITY): Troponin I (High Sensitivity): 3 ng/L (ref <18)

## 2023-04-07 NOTE — Discharge Instructions (Signed)
It was a pleasure taking care of you here in the emergency department  Return for new or worsening symptoms, I have attached a picture of the parking at maternal admissions unit at [facility]

## 2023-04-07 NOTE — Progress Notes (Signed)
RROB RN called to [place] ed. Pt reports not feeling the baby move since 1000. No leaking or bleeding reported. No pain reported. FHR upon arrival 110 via ultrasound. Staff having trouble finding FHR with doppler. EFM applied FHR 140. Audible fetal movement and also seen on ultrasound. Monitors dcd with FHR 135 with 10x10accels and variable lasting 25 sec and one lasting 10 sec. Dr Adrian Blackwater updated at 1616. Pt complained of SOB and heaviness in chest. RN notified. O2 sat 100 p 120

## 2023-04-07 NOTE — ED Provider Notes (Signed)
[place] EMERGENCY DEPARTMENT AT [facility] Provider Note   CSN: [id number] Arrival date & time: 04/07/23  1512    History  Chief Complaint  Patient presents with   Decreased Fetal Movement    Michaela Maddox is a 22 y.o. female G1, P0 approximately [redacted] weeks pregnant here for evaluation of decreased fetal movement.  Last time she felt baby move was approximately 10 AM this morning, 6 hours PTA.  She has tried eating, drinking soda, resting without any movement.  Pregnancy complicated by hyperthyroidism.  She follows at with an OB/GYN in [place].  She denies any fever, nausea, vomiting, vaginal discharge, fluid leakage, vaginal bleeding.  She is here at [facility] for school.  She denies any abdominal pain, pressure to the GU region.  HPI     Home Medications Prior to Admission medications   Not on File      Allergies    Patient has no known allergies.    Review of Systems   Review of Systems  Constitutional: Negative.   HENT: Negative.    Respiratory: Negative.    Cardiovascular: Negative.   Gastrointestinal: Negative.   Genitourinary: Negative.   Musculoskeletal: Negative.   Skin: Negative.   Neurological: Negative.   All other systems reviewed and are negative.   Physical Exam Updated Vital Signs BP 132/87   Pulse (!) 110   Temp 98.2 F (36.8 C) (Oral)   Resp 20   Ht 5\' 9"  (1.753 m)   Wt 104.3 kg   SpO2 100%   BMI 33.96 kg/m  Physical Exam Vitals and nursing note reviewed.  Constitutional:      General: She is not in acute distress.    Appearance: She is well-developed. She is not ill-appearing, toxic-appearing or diaphoretic.  HENT:     Head: Atraumatic.     Nose: Nose normal.     Mouth/Throat:     Mouth: Mucous membranes are moist.  Eyes:     Pupils: Pupils are equal, round, and reactive to light.  Cardiovascular:     Rate and Rhythm: Normal rate.     Pulses: Normal pulses.     Heart sounds: Normal heart sounds.  Pulmonary:      Effort: Pulmonary effort is normal. No respiratory distress.     Breath sounds: Normal breath sounds.  Abdominal:     General: There is no distension.     Tenderness: There is no abdominal tenderness. There is no right CVA tenderness, left CVA tenderness or guarding.     Comments: Gravid abdomen above the umbilicus  Musculoskeletal:        General: No swelling, tenderness, deformity or signs of injury. Normal range of motion.     Cervical back: Normal range of motion.     Right lower leg: No edema.     Left lower leg: No edema.  Skin:    General: Skin is warm and dry.     Capillary Refill: Capillary refill takes less than 2 seconds.  Neurological:     General: No focal deficit present.     Mental Status: She is alert.  Psychiatric:        Mood and Affect: Mood normal.    ED Results / Procedures / Treatments   Labs (all labs ordered are listed, but only abnormal results are displayed) Labs Reviewed  CBC WITH DIFFERENTIAL/PLATELET - Abnormal; Notable for the following components:      Result Value   WBC 13.2 (*)  Hemoglobin 10.2 (*)    HCT 31.4 (*)    MCV 79.9 (*)    RDW 17.2 (*)    Neutro Abs 7.8 (*)    Monocytes Absolute 1.3 (*)    Abs Immature Granulocytes 0.09 (*)    All other components within normal limits  BASIC METABOLIC PANEL - Abnormal; Notable for the following components:   CO2 18 (*)    BUN <5 (*)    Calcium 8.7 (*)    All other components within normal limits  URINALYSIS, W/ REFLEX TO CULTURE (INFECTION SUSPECTED)  TROPONIN I (HIGH SENSITIVITY)  TROPONIN I (HIGH SENSITIVITY)    EKG EKG Interpretation Date/Time:  Monday April 07 2023 16:43:29 EST Ventricular Rate:  104 PR Interval:  153 QRS Duration:  85 QT Interval:  348 QTC Calculation: 458 R Axis:   74  Text Interpretation: Sinus tachycardia No significant change since last tracing Confirmed by Melene Plan [phone]) on 04/07/2023 5:41:30 PM  Radiology No results  found.  Procedures Ultrasound ED OB Pelvic  Date/Time: 04/07/2023 4:07 PM  Performed by: Ralph Leyden A, PA-C Authorized by: Ralph Leyden A, PA-C   Procedure details:    Indications: pregnant with no fetal heart sounds     Images: archived    Uterine findings:    Single gestation: identified     Fetal heart rate: identified     Estimated gestational age: 87 weeks Left ovary findings:    Left ovary:  Not visualized    Right ovary findings:     Right ovary:  Not visualized    Comments:     28-week pregnancy here with no fetal movements over the last 6 hours.     Medications Ordered in ED Medications - No data to display  ED Course/ Medical Decision Making/ A&P   G1, P0 approximately [redacted] weeks pregnant here for evaluation of decreased fetal movement.  She is here for school her OB/GYN is in [place].  Last felt baby move 6 hours PTA.  Trying home remedies without movement.  No abdominal pain, vaginal bleeding, fluid leakage, pressure.  On arrival patient afebrile, normotensive.  Gravid abdomen.  Triage PA, nurse were unable to obtain fetal heart tones.  I immediately went to the room for cord bedside ultrasound which showed fetal movement as well as FHT. Rapid OB nurse called to nurse. Toco placed per nursing. Will plan on basic labs, UA, Ob nurse to assess.  Patient had mention to charge nurse and MAU provider that she had an episode of chest pain.  When I went to assess patient and she states she no longer had the pain.  She stated it was when she laid flat and she felt a pinch sensation.  She states this was quickly fleeting.  She denies any PND, orthopnea, pain or swelling in calves, history of PE, DVT, pleuritic or exertional chest pain.  No cough or fever.  EKG was added on as well as troponin by triage nurse.  Labs and imaging personally viewed and interpreted:  CBC leukocytosis 13.2 Metabolic panel without significant abnormality Troponin 3 EKG without  ischemic changes  Discussed chest x-ray in setting of pregnancy, pt declined as she is currently asymptomatic.  Patient reassessed.  She is now feeling baby move.  Patient was cleared by GYN.  With regards to her chest pain have low suspicion for acute ACS, PE, dissection, pneumothorax, bacterial infectious process, myocarditis, pericarditis.  Will have her follow-up outpatient, return for new or worsening symptoms.  The patient has been appropriately medically screened and/or stabilized in the ED. I have low suspicion for any other emergent medical condition which would require further screening, evaluation or treatment in the ED or require inpatient management.  Patient is hemodynamically stable and in no acute distress.  Patient able to ambulate in department prior to ED.  Evaluation does not show acute pathology that would require ongoing or additional emergent interventions while in the emergency department or further inpatient treatment.  I have discussed the diagnosis with the patient and answered all questions.  Pain is been managed while in the emergency department and patient has no further complaints prior to discharge.  Patient is comfortable with plan discussed in room and is stable for discharge at this time.  I have discussed strict return precautions for returning to the emergency department.  Patient was encouraged to follow-up with PCP/specialist refer to at discharge.                                 Medical Decision Making Amount and/or Complexity of Data Reviewed External Data Reviewed: labs, radiology, ECG and notes. Labs: ordered. Decision-making details documented in ED Course. Radiology: ordered and independent interpretation performed. Decision-making details documented in ED Course.  Risk OTC drugs. Decision regarding hospitalization. Diagnosis or treatment significantly limited by social determinants of health.         Final Clinical Impression(s) / ED  Diagnoses Final diagnoses:  Decreased fetal movements in third trimester, single or unspecified fetus    Rx / DC Orders ED Discharge Orders     None         Klee Kolek A, PA-C 04/07/23 2150    Melene Plan, DO 04/07/23 2236

## 2023-04-07 NOTE — ED Notes (Signed)
Reported transient episode of chest pain and SOB. Provider notified. EKG and trop ordered

## 2023-04-07 NOTE — ED Triage Notes (Signed)
Patient is here for evaluation of not feeling her baby move since 1000 this morning. Reports she is [redacted] weeks pregnant. OBGYN is in [place], she is here for school.
